# Patient Record
Sex: Female | Born: 1985 | Race: Black or African American | Hispanic: No | Marital: Single
Health system: Southern US, Community
[De-identification: ages and names within clinical notes are randomized; demographics above are authoritative.]

---
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
## 2005-01-08 ENCOUNTER — Emergency Department (HOSPITAL_COMMUNITY): Admission: EM | Admit: 2005-01-08 | Discharge: 2005-01-08 | Payer: Self-pay | Admitting: Emergency Medicine

## 2009-12-04 ENCOUNTER — Emergency Department (HOSPITAL_COMMUNITY): Admission: EM | Admit: 2009-12-04 | Discharge: 2009-12-04 | Payer: Self-pay | Admitting: Emergency Medicine

## 2009-12-06 ENCOUNTER — Emergency Department (HOSPITAL_COMMUNITY)
Admission: EM | Admit: 2009-12-06 | Discharge: 2009-12-06 | Payer: Self-pay | Source: Home / Self Care | Admitting: Emergency Medicine

## 2010-01-02 ENCOUNTER — Ambulatory Visit (HOSPITAL_BASED_OUTPATIENT_CLINIC_OR_DEPARTMENT_OTHER)
Admission: RE | Admit: 2010-01-02 | Discharge: 2010-01-02 | Payer: Self-pay | Source: Home / Self Care | Admitting: Oral Surgery

## 2010-04-14 LAB — POCT HEMOGLOBIN-HEMACUE: Hemoglobin: 11.6 g/dL — ABNORMAL LOW (ref 12.0–15.0)

## 2010-04-15 LAB — COMPREHENSIVE METABOLIC PANEL
ALT: 9 U/L (ref 0–35)
AST: 16 U/L (ref 0–37)
Albumin: 4.2 g/dL (ref 3.5–5.2)
Alkaline Phosphatase: 77 U/L (ref 39–117)
BUN: 15 mg/dL (ref 6–23)
CO2: 26 mEq/L (ref 19–32)
Calcium: 9.7 mg/dL (ref 8.4–10.5)
Chloride: 105 mEq/L (ref 96–112)
Creatinine, Ser: 0.74 mg/dL (ref 0.4–1.2)
GFR calc Af Amer: >60 mL/min (ref >60)
GFR calc non Af Amer: >60 mL/min (ref >60)
Glucose, Bld: 93 mg/dL (ref 70–99)
Potassium: 4 mEq/L (ref 3.5–5.1)
Sodium: 141 mEq/L (ref 135–145)
Total Bilirubin: 1.3 mg/dL — ABNORMAL HIGH (ref 0.3–1.2)
Total Protein: 7.5 g/dL (ref 6.0–8.3)

## 2010-04-15 LAB — CBC
HCT: 37.2 % (ref 36.0–46.0)
Hemoglobin: 12.4 g/dL (ref 12.0–15.0)
MCH: 30 pg (ref 26.0–34.0)
MCHC: 33.5 g/dL (ref 30.0–36.0)
MCV: 89.5 fL (ref 78.0–100.0)
Platelets: 227 10*3/uL (ref 150–400)
RBC: 4.15 MIL/uL (ref 3.87–5.11)
RDW: 12.9 % (ref 11.5–15.5)
WBC: 4.1 10*3/uL (ref 4.0–10.5)

## 2010-04-15 LAB — DIFFERENTIAL
Basophils Absolute: 0 10*3/uL (ref 0.0–0.1)
Basophils Relative: 1 % (ref 0–1)
Eosinophils Absolute: 0.2 10*3/uL (ref 0.0–0.7)
Eosinophils Relative: 4 % (ref 0–5)
Lymphocytes Relative: 32 % (ref 12–46)
Lymphs Abs: 1.3 10*3/uL (ref 0.7–4.0)
Monocytes Absolute: 0.5 10*3/uL (ref 0.1–1.0)
Monocytes Relative: 11 % (ref 3–12)
Neutro Abs: 2.1 10*3/uL (ref 1.7–7.7)
Neutrophils Relative %: 52 % (ref 43–77)

## 2010-04-15 LAB — RAPID URINE DRUG SCREEN, HOSP PERFORMED
Amphetamines: NOT DETECTED
Barbiturates: NOT DETECTED
Benzodiazepines: NOT DETECTED
Cocaine: NOT DETECTED
Opiates: POSITIVE — AB
Tetrahydrocannabinol: NOT DETECTED

## 2010-04-15 LAB — URINALYSIS, ROUTINE W REFLEX MICROSCOPIC
Bilirubin Urine: NEGATIVE
Bilirubin Urine: NEGATIVE
Glucose, UA: NEGATIVE mg/dL
Glucose, UA: NEGATIVE mg/dL
Hgb urine dipstick: NEGATIVE
Ketones, ur: NEGATIVE mg/dL
Ketones, ur: NEGATIVE mg/dL
Nitrite: NEGATIVE
Nitrite: NEGATIVE
Protein, ur: 30 mg/dL — AB
Protein, ur: NEGATIVE mg/dL
Specific Gravity, Urine: 1.022 (ref 1.005–1.030)
Specific Gravity, Urine: 1.023 (ref 1.005–1.030)
Urobilinogen, UA: 0.2 mg/dL (ref 0.0–1.0)
Urobilinogen, UA: 1 mg/dL (ref 0.0–1.0)
pH: 5 (ref 5.0–8.0)
pH: 6 (ref 5.0–8.0)

## 2010-04-15 LAB — URINE MICROSCOPIC-ADD ON

## 2010-04-15 LAB — LIPASE, BLOOD: Lipase: 32 U/L (ref 11–59)

## 2010-04-15 LAB — ETHANOL: Alcohol, Ethyl (B): <5 mg/dL (ref 0–10)

## 2010-04-15 LAB — POCT PREGNANCY, URINE: Preg Test, Ur: NEGATIVE

## 2010-06-20 NOTE — Consult Note (Signed)
Linda Tapia, Linda Tapia          ACCOUNT NO.:  [id number]   MEDICAL RECORD NO.:  [id number]          PATIENT TYPE:  EMS   LOCATION:  MAJO                         FACILITY:  [facility]   PHYSICIAN:  Michael L. Reynolds, M.D.DATE OF BIRTH:  [birth year]   DATE OF CONSULTATION:  01/08/2005  DATE OF DISCHARGE:                                   CONSULTATION   CHIEF COMPLAINT:  Seizures.   HISTORY OF PRESENT ILLNESS:  Linda Tapia is a 25 year old girl with a past  medical history which includes mental retardation and recent onset of  seizures about two months ago for which she has been seeing Dr. Sharene Skeans.  She has had an MRI and EEG and was started on Keppra monotherapy about three  weeks ago according to her mother.  The patient has had several bouts of  serial seizures at school.  Today at school, she reportedly had about seven  seizures between about 1310 and about 1350.  No really good description of  the events is available, although there are some reports of them being  convulsive.  The patient was brought to the emergency room somewhat  confused, but her mother reports that she is now back at her baseline.  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  She has not had any other  trials of anti-epileptic medications.  The patient herself denies pain.  Her  mother reports that she has not otherwise been ill or had any other changes  in her eating or sleep habits or other possible precipitating factors.   PAST MEDICAL HISTORY:  As above.  She denies any other chronic medical  problems.   SOCIAL HISTORY:  She is senior at [employer] and lives  with her mother.   MEDICATIONS:  1.  Keppra 500 mg b.i.d.  2.  Claritin-D.   REVIEW OF SYSTEMS:  No recent fever or chills.  No upper respiratory  symptoms.  No recent illness.  No change in diet or sleep habits.   PHYSICAL EXAMINATION:  VITAL SIGNS:   Temperature 97.3, blood pressure  108/64, pulse 94, respirations 20.  GENERAL:  This is a thin, but healthy-appearing adolescent in no evident  distress.  NEUROLOGIC:  She is somewhat oriented to place, knowing that she is in a  place where people go when they get sick.  Her speech is slow but is at  baseline per her mother.  She is able to follow one-step commands.  Cranial  nerves:  Pupils are 3 mm and briskly reactive.  Extraocular muscles are full  without nystagmus.  Face, tongue, and palate move normally and  symmetrically.  Motor exam:  Normal bulk and tone.  Normal strength in all  tested extremity muscles.  Sensation is intact to light touch in all  extremities.  Coordination:  Finger-to-nose performed adequately.  Reflexes  2+ and symmetric.  Toes are upgoing bilaterally.  Gait exam is deferred.   LABORATORY DATA:  CBC and BMET are normal.   IMPRESSION:  Breakthrough seizures on Keppra monotherapy.  PLAN:  Will provide an intravenous infusion of 1000 mg of Keppra tonight and  then increase her daily dose of Keppra to 750 mg b.i.d.  She will contact  Dr. Sharene Skeans by phone tomorrow and arrange for the followup at that time as  felt clinically appropriate.      Michael L. Thad Ranger, M.D.  Electronically Signed     MLR/MEDQ  D:  01/08/2005  T:  01/09/2005  Job:  [id number]   cc:   Deanna Artis. Sharene Skeans, M.D.  Fax: [phone]

## 2010-06-20 NOTE — Procedures (Signed)
EEG NUMBER:  [id number]. 2:04.   CLINICAL HISTORY:  The patient is an 25 year old with a seizure that  occurred last week. Her body became stiff.  She was unable to respond. She  had a prior seizure age 62.   PROCEDURE:  The tracing was carried out on a 32-channel digital Cadwell  recorder reformatted into to 16-channel montages with one devoted to EKG.  The patient was awake during the recording. The International 10/20 system  lead placement used.   DESCRIPTION OF FINDINGS:  Dominant frequency is 8 Hz 50-55 microvolt  activity that is well modulated and regulated and attenuates partially with  eye opening.   Background activity is a mixture of alpha and beta range activity that is  under 20 microvolts. Occasional theta and rate polymorphic delta range  activity was seen centrally and posteriorly. There was no focal slowing.  There was no interictal epileptiform activity in the form of spikes or sharp  waves. Activating procedures with hyperventilation caused no significant  change. Intermittent photic stimulation induced a partial driving response.   EKG showed a regular sinus rhythm with ventricular response of 84 beats per  minute.   IMPRESSION:  Normal waking record.      Linda Tapia. Sharene Skeans, M.D.  Electronically Signed     EAV:WUJW  D:  11/12/2004 15:45:29  T:  11/12/2004 22:19:35  Job #:  [id number]   cc:   [facility] [address]  [address] [place], [place]  [address]

## 2010-06-20 NOTE — Procedures (Signed)
EEG NUMBER:  [id number].   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  The patient has had  seizures both day and night.  They have been quite frequent.  Previous EEG  in the waking state was normal on November 12, 2004. The study is being done  to look for the presence of seizures in the sleep-deprived study.   PROCEDURE:  The tracing is carried out on a 32-channel digital Cadwell  recorder reformatted into 16-channel Montages with one devoted to EKG. The  patient was awake and drowsy during the recording.  The international 10-20  system lead placement used.   DESCRIPTION OF FINDINGS:  Dominant frequency is an 8 Hz, 45-50 mcV activity.  It is well- regulated and somewhat higher voltage on the left than the right  (25 mcV).  The patient becomes drowsy with mixed frequency theta and delta  range activity.  However, she arouses and never enters light natural sleep.   Background activity as predominant beta range components of 10-15 mcV.  There was no focal slowing.  There was no interictal epileptiform activity  in the form of spikes or sharp waves.  Activating procedures with  intermittent photic stimulation failed to induce a definite driving  response.  Hyperventilation was carried out with poor effort and caused no  significant change.   IMPRESSION:  In the waking state and drowsiness after sleep deprivation this  is a normal study.      Deanna Artis. Sharene Skeans, M.D.  Electronically Signed     NWG:NFAO  D:  12/05/2004 18:27:12  T:  12/06/2004 07:34:48  Job #:  [id number]   cc:   [facility]

## 2012-03-11 IMAGING — CT CT ABD-PELV W/ CM
2 of 4 series · 17 of 46 positions shown, 19 images · IV contrast (agent unspecified)
Comparison: None

CLINICAL DATA: Mid abdominal and rectal pain, history mental
retardation

CT ABDOMEN AND PELVIS WITH CONTRAST
TECHNIQUE: Multidetector CT imaging of the abdomen and pelvis was
performed following the standard protocol during bolus
administration of intravenous contrast.
Contrast: Dilute oral contrast and 75 ml Rmnipaque-E22 IV

[Series 2: abd/pelv with 5.0 b31f st · axial · 0.63mm/px · z∈[+841,+1266]mm · 14 of 93 slices shown, 16 images]
[im 4/93  soft-tissue]
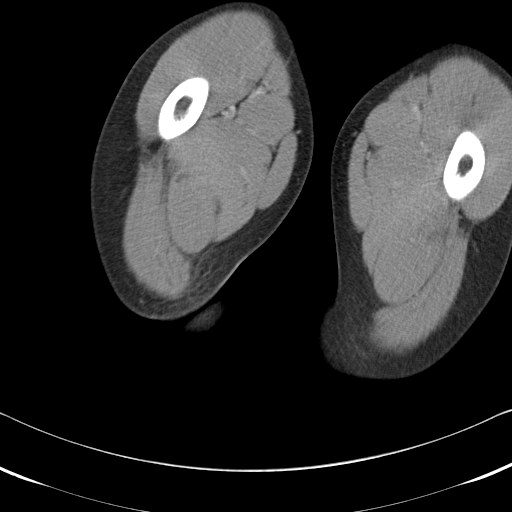
[im 4/93  bone]
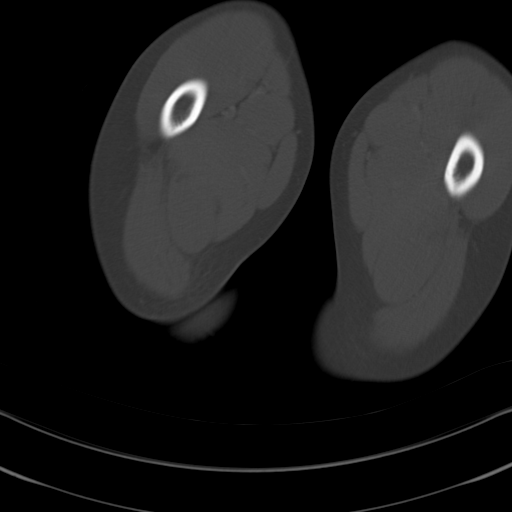
[im 12/93  soft-tissue]
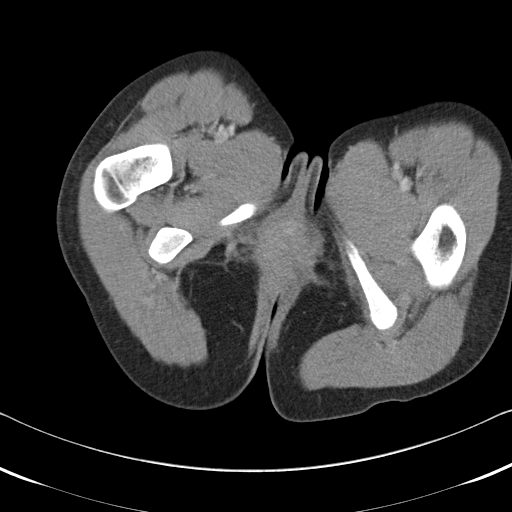
[im 20/93  soft-tissue]
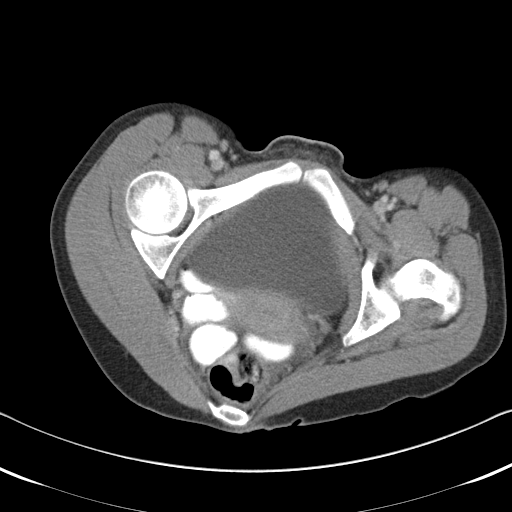
[im 24/93  soft-tissue]
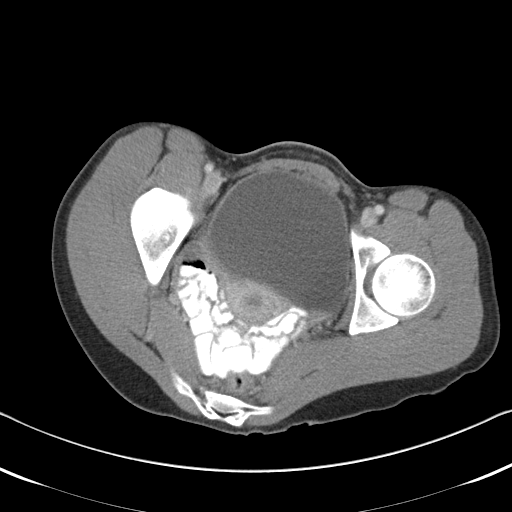
[im 31/93  soft-tissue]
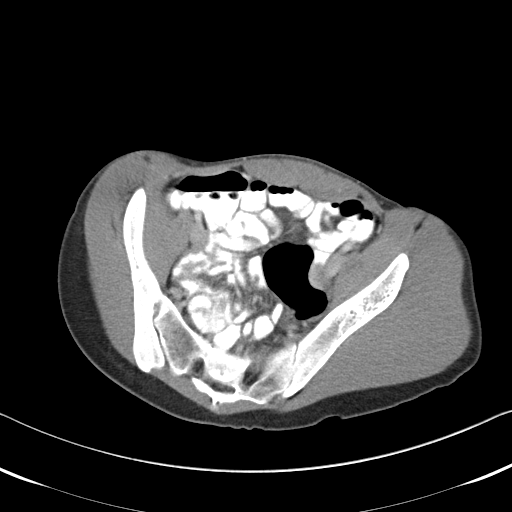
[im 39/93  soft-tissue]
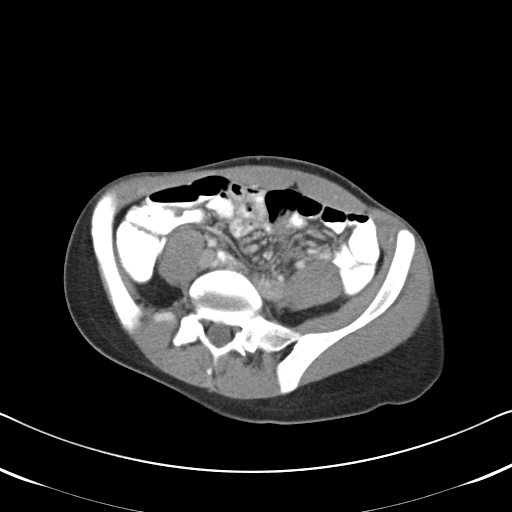
[im 43/93  soft-tissue]
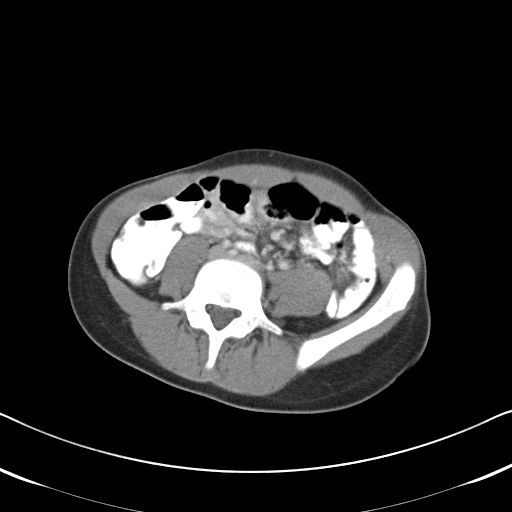
[im 50/93  soft-tissue]
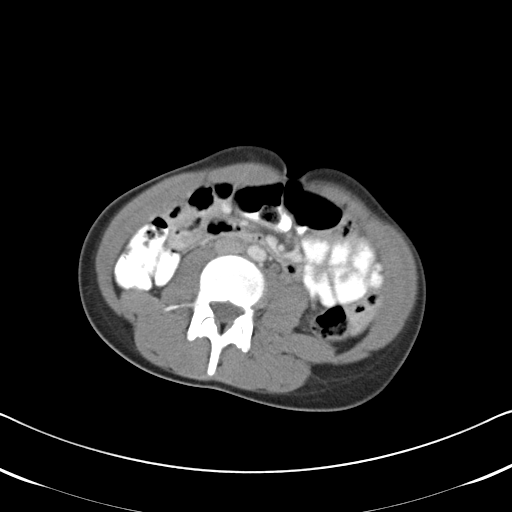
[im 54/93  soft-tissue]
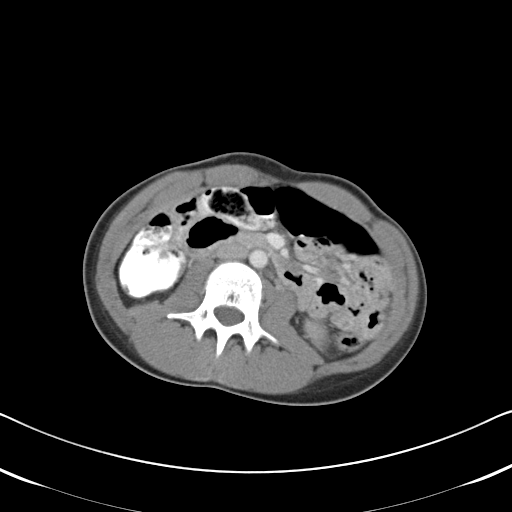
[im 54/93  bone]
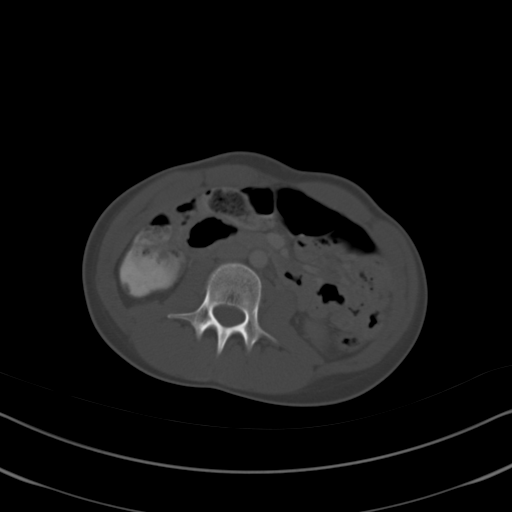
[im 62/93  soft-tissue]
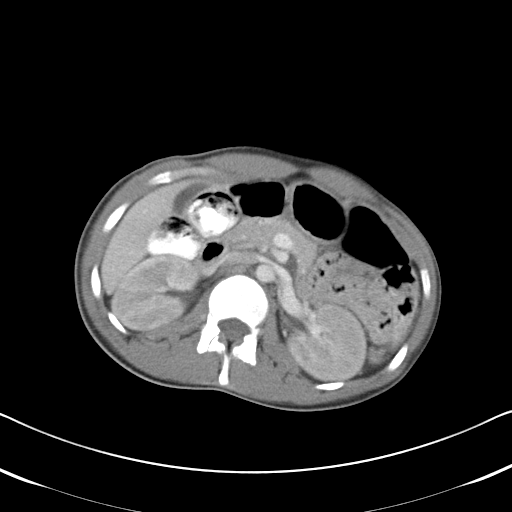
[im 70/93  soft-tissue]
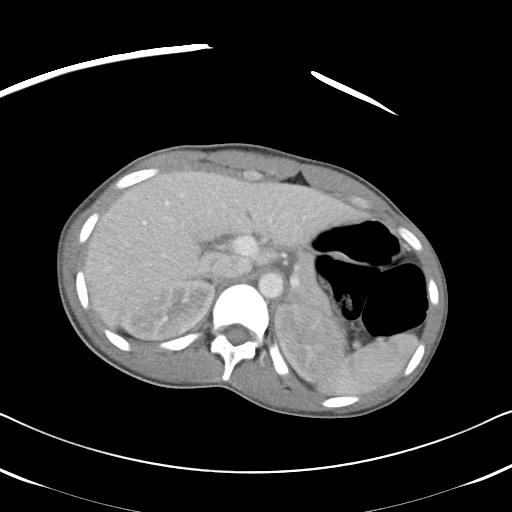
[im 73/93  soft-tissue]
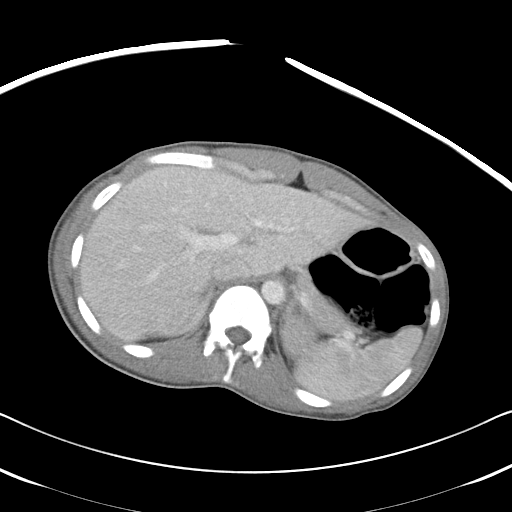
[im 81/93  soft-tissue]
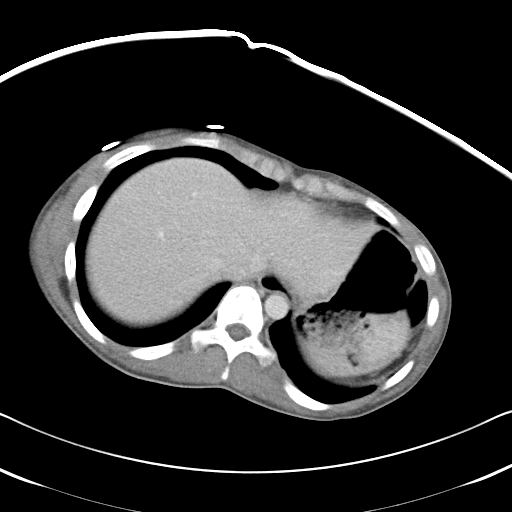
[im 89/93  soft-tissue]
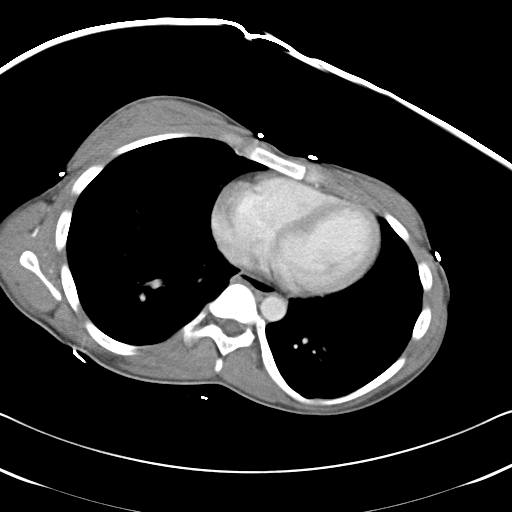

[Series 602: coronal · coronal · 0.91mm/px · 3 of 66 slices shown]
[im 22/66  soft-tissue]
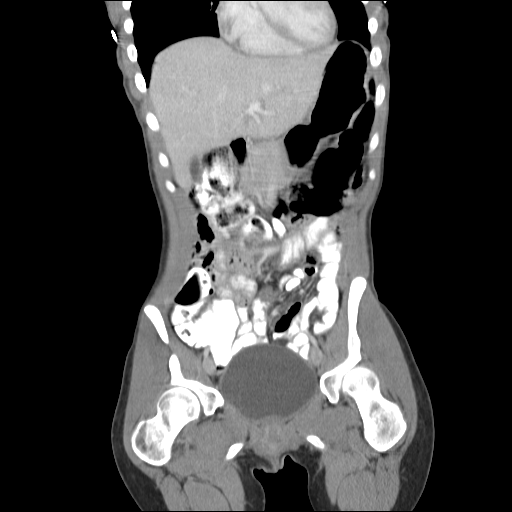
[im 29/66  soft-tissue]
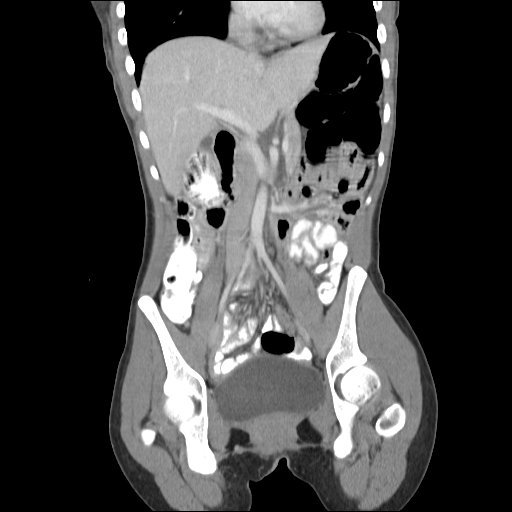
[im 37/66  soft-tissue]
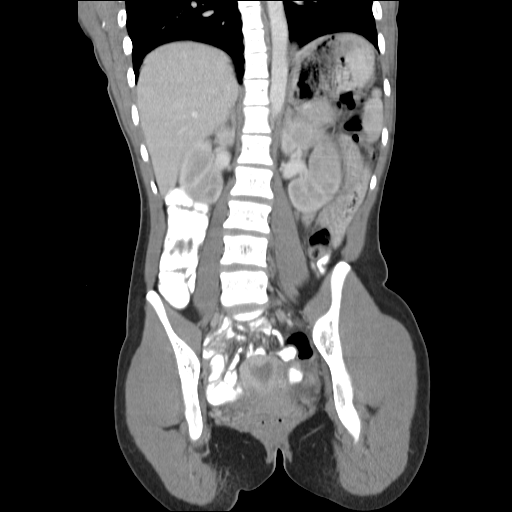

[17 of 46 positions shown; findings below may reference images not displayed]

FINDINGS: Lung bases clear.
Mildly inhomogeneous nephrograms both kidneys, raising question of
pyelonephritis.
Tiny cyst left kidney 4 mm diameter image 25.
Liver, spleen, pancreas, and adrenal glands unremarkable.
Normal appendix.
Stomach and bowel loops unremarkable.
No mass, adenopathy, free fluid or inflammatory process.
Unremarkable bladder, uterus and adnexae.
No acute osseous findings.
IMPRESSION: Inhomogeneous nephrograms bilaterally raising question of
pyelonephritis, recommend correlation with urinalysis.
Remainder of exam unremarkable.

## 2018-08-11 ENCOUNTER — Telehealth: Payer: Self-pay | Admitting: Pediatrics

## 2018-08-11 DIAGNOSIS — Z20822 Contact with and (suspected) exposure to covid-19: Secondary | ICD-10-CM

## 2018-08-11 NOTE — Telephone Encounter (Signed)
Attempted to reach [facility]. No answer. Left vm to call back. Lab was ordered.

## 2018-08-11 NOTE — Telephone Encounter (Signed)
Note   Linda Tapia with [facility], Dr Marisue Humble office. Calling to request covid test for the pt.  Pt has been exposed to a positive person.  Call back to pt: [phone]         Pt scheduled for tomorrow at [facility]. Pt with possible exposure  Testing process reviewed with pt mother, stay in car, wear mask; verbalizes understanding.  CB# [phone]  'Special needs'patient

## 2018-08-11 NOTE — Telephone Encounter (Signed)
Jasmine with [facility], Dr Marisue Humble office. Calling to request covid test for the pt.  Pt has been exposed to a positive person.  Call back to pt: [phone]  Mom Vaughan Basta will answer the phone, as pt is not capable and schedule the appt.  [place] is the closest location for pt. She is staying in [place] with the mom.

## 2018-08-12 ENCOUNTER — Other Ambulatory Visit: Payer: Self-pay

## 2018-08-12 DIAGNOSIS — Z20822 Contact with and (suspected) exposure to covid-19: Secondary | ICD-10-CM

## 2018-08-18 LAB — NOVEL CORONAVIRUS, NAA: SARS-CoV-2, NAA: NOT DETECTED

## 2018-08-22 ENCOUNTER — Telehealth: Payer: Self-pay | Admitting: General Practice

## 2018-08-22 NOTE — Telephone Encounter (Signed)
Patient's mother given negative covid-19 result. Verbalized understanding.

## 2023-03-26 ENCOUNTER — Other Ambulatory Visit: Payer: Self-pay | Admitting: Family Medicine

## 2023-03-26 DIAGNOSIS — Z1231 Encounter for screening mammogram for malignant neoplasm of breast: Secondary | ICD-10-CM
